# Patient Record
Sex: Female | Born: 1995 | Race: Black or African American | Hispanic: No | Marital: Married | Smoking: Never smoker
Health system: Southern US, Community
[De-identification: ages and names within clinical notes are randomized; demographics above are authoritative.]

## PROBLEM LIST (undated history)

## (undated) DIAGNOSIS — Z789 Other specified health status: Secondary | ICD-10-CM

## (undated) HISTORY — PX: NO PAST SURGERIES: SHX2092

---
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
## 2013-12-18 ENCOUNTER — Encounter (HOSPITAL_BASED_OUTPATIENT_CLINIC_OR_DEPARTMENT_OTHER): Payer: Self-pay | Admitting: Emergency Medicine

## 2013-12-18 DIAGNOSIS — Y92410 Unspecified street and highway as the place of occurrence of the external cause: Secondary | ICD-10-CM | POA: Insufficient documentation

## 2013-12-18 DIAGNOSIS — S99919A Unspecified injury of unspecified ankle, initial encounter: Principal | ICD-10-CM

## 2013-12-18 DIAGNOSIS — S8990XA Unspecified injury of unspecified lower leg, initial encounter: Secondary | ICD-10-CM | POA: Insufficient documentation

## 2013-12-18 DIAGNOSIS — S0990XA Unspecified injury of head, initial encounter: Secondary | ICD-10-CM | POA: Insufficient documentation

## 2013-12-18 DIAGNOSIS — Y9389 Activity, other specified: Secondary | ICD-10-CM | POA: Insufficient documentation

## 2013-12-18 DIAGNOSIS — S99929A Unspecified injury of unspecified foot, initial encounter: Principal | ICD-10-CM
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
## 2013-12-19 ENCOUNTER — Emergency Department (HOSPITAL_BASED_OUTPATIENT_CLINIC_OR_DEPARTMENT_OTHER)
Admission: EM | Admit: 2013-12-19 | Discharge: 2013-12-19 | Disposition: A | Discharge status: Home / Self Care | Payer: BC Managed Care – PPO | Attending: Emergency Medicine | Admitting: Emergency Medicine

## 2013-12-19 MED ORDER — IBUPROFEN 400 MG PO TABS
400.0000 mg | ORAL_TABLET | Freq: Once | ORAL | Status: AC
  Administered 2013-12-19: 400 mg via ORAL
  Filled 2013-12-19: qty 1
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
## 2013-12-19 NOTE — ED Notes (Signed)
Given note for work and ice pack at d/c. Family present
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
## 2015-03-26 ENCOUNTER — Encounter (HOSPITAL_BASED_OUTPATIENT_CLINIC_OR_DEPARTMENT_OTHER): Payer: Self-pay | Admitting: *Deleted

## 2015-03-26 ENCOUNTER — Emergency Department (HOSPITAL_BASED_OUTPATIENT_CLINIC_OR_DEPARTMENT_OTHER)
Admission: EM | Admit: 2015-03-26 | Discharge: 2015-03-26 | Disposition: A | Discharge status: Home / Self Care | Payer: Managed Care, Other (non HMO) | Attending: Emergency Medicine | Admitting: Emergency Medicine

## 2015-03-26 DIAGNOSIS — Z3202 Encounter for pregnancy test, result negative: Secondary | ICD-10-CM | POA: Diagnosis not present

## 2015-03-26 DIAGNOSIS — R1031 Right lower quadrant pain: Secondary | ICD-10-CM | POA: Insufficient documentation

## 2015-03-26 DIAGNOSIS — R109 Unspecified abdominal pain: Secondary | ICD-10-CM | POA: Diagnosis not present

## 2015-03-26 DIAGNOSIS — R197 Diarrhea, unspecified: Secondary | ICD-10-CM | POA: Diagnosis not present

## 2015-03-26 DIAGNOSIS — M545 Low back pain: Secondary | ICD-10-CM | POA: Insufficient documentation

## 2015-03-26 DIAGNOSIS — R112 Nausea with vomiting, unspecified: Secondary | ICD-10-CM | POA: Insufficient documentation

## 2015-03-26 DIAGNOSIS — K5900 Constipation, unspecified: Secondary | ICD-10-CM | POA: Diagnosis not present

## 2015-03-26 DIAGNOSIS — R1032 Left lower quadrant pain: Secondary | ICD-10-CM | POA: Diagnosis present

## 2015-03-26 LAB — URINALYSIS, ROUTINE W REFLEX MICROSCOPIC
Bilirubin Urine: NEGATIVE
Glucose, UA: NEGATIVE mg/dL
Hgb urine dipstick: NEGATIVE
Ketones, ur: NEGATIVE mg/dL
LEUKOCYTES UA: NEGATIVE
NITRITE: NEGATIVE
Protein, ur: NEGATIVE mg/dL
SPECIFIC GRAVITY, URINE: 1.029 (ref 1.005–1.030)
UROBILINOGEN UA: 0.2 mg/dL (ref 0.0–1.0)
pH: 6.5 (ref 5.0–8.0)

## 2015-03-26 LAB — PREGNANCY, URINE: PREG TEST UR: NEGATIVE

## 2015-03-26 NOTE — ED Notes (Signed)
Pt c/o diffuse add pain and nausea "off and on" x 2 months

## 2015-03-26 NOTE — ED Provider Notes (Signed)
CSN: [id number]     Arrival date & time 03/26/15  1539 History  This chart was scribed for  Zadie Rhineonald Erasmo Vertz, MD by Bethel BornBritney McCollum, ED Scribe. This patient was seen in room MH07/MH07 and the patient's care was started at 4:06 PM.   Chief Complaint  Patient presents with  . Abdominal Pain    Patient is a 19 y.o. female presenting with abdominal pain. The history is provided by the patient. No language interpreter was used.  Abdominal Pain Pain location:  LLQ and RLQ Pain quality: cramping   Pain severity:  Moderate Duration:  8 weeks Timing:  Intermittent Progression:  Unchanged Chronicity:  New Relieved by:  Urination Associated symptoms: constipation, diarrhea, hematochezia, nausea and vomiting   Associated symptoms: no fever, no melena, no vaginal bleeding and no vaginal discharge   Nausea:    Severity:  Mild   Timing:  Intermittent Vomiting:    Quality:  Stomach contents   Timing:  Sporadic Risk factors: has not had multiple surgeries    Shelley Preston is a 19 y.o. female who presents to the Emergency Department complaining of intermittent lower abdominal pain with onset 2 months ago. She describes the pain as cramping and rates in 4/10 in severity. Today the pain improved after urination. Associated symptoms include intermittent nausea, vomiting (none today), alternate diarrhea and constipation, and lower back pain. Pt notes episodes of small amounts of blood in her stool. She is concerned for pregnancy and notes that for the last several months her menstrual periods have been unusually short lasting for 2 days.  Pt denies recent weight change, fever, vaginal discharge, and current vaginal bleeding.   PMH - none  History  Substance Use Topics  . Smoking status: Never Smoker   . Smokeless tobacco: Never Used  . Alcohol Use: No   OB History    No data available     Review of Systems  Constitutional: Negative for fever.  Gastrointestinal: Positive for nausea, vomiting,  abdominal pain, diarrhea, constipation and hematochezia. Negative for melena.  Genitourinary: Negative for vaginal bleeding and vaginal discharge.  Musculoskeletal: Positive for back pain. Arthralgias: lower.  All other systems reviewed and are negative.     Allergies  Review of patient's allergies indicates no known allergies.  Home Medications   Prior to Admission medications   Not on File   Triage Vitals: BP 123/65 mmHg  Pulse 100  Temp(Src) 98.3 F (36.8 C)  Resp 18  Ht 5\' 5"  (1.651 m)  Wt 170 lb (77.111 kg)  BMI 28.29 kg/m2  SpO2 100%  LMP 02/14/2015 Physical Exam CONSTITUTIONAL: Well developed/well nourished HEAD: Normocephalic/atraumatic EYES: EOMI/PERRL ENMT: Mucous membranes moist NECK: supple no meningeal signs SPINE/BACK:entire spine nontender CV: S1/S2 noted, no murmurs/rubs/gallops noted LUNGS: Lungs are clear to auscultation bilaterally, no apparent distress ABDOMEN: soft, nontender, no rebound or guarding, bowel sounds noted throughout abdomen GU:no cva tenderness NEURO: Pt is awake/alert/appropriate, moves all extremitiesx4.  No facial droop.   EXTREMITIES: pulses normal/equal, full ROM SKIN: warm, color normal PSYCH: no abnormalities of mood noted, alert and oriented to situation  ED Course  Procedures  DIAGNOSTIC STUDIES: Oxygen Saturation is 100% on RA, normal by my interpretation.    COORDINATION OF CARE: 4:12 PM Discussed treatment plan which includes lab work with pt at bedside and pt agreed to plan. Pt without any symptoms at this time abd soft She is well appearing Symptoms on/off for 2 months She has no signs of acute abd/GYN emergency at this  time She mentioned small amt of blood in stool but no other reports signs of GI bleed She is stable for d/c home We discussed strict return precautions  Labs Review Labs Reviewed  PREGNANCY, URINE  URINALYSIS, ROUTINE W REFLEX MICROSCOPIC (NOT AT [facility])    Imaging Review No results  found.   EKG Interpretation None      MDM   Final diagnoses:  Abdominal pain, unspecified abdominal location     Nursing notes including past medical history and social history reviewed and considered in documentation Labs/vital reviewed myself and considered during evaluation   I personally performed the services described in this documentation, which was scribed in my presence. The recorded information has been reviewed and is accurate.    Zadie Rhine, MD 03/26/15 [phone]

## 2015-03-26 NOTE — Discharge Instructions (Signed)

## 2015-05-18 ENCOUNTER — Inpatient Hospital Stay (HOSPITAL_COMMUNITY)

## 2015-05-18 ENCOUNTER — Encounter (HOSPITAL_COMMUNITY): Payer: Self-pay | Admitting: *Deleted

## 2015-05-18 ENCOUNTER — Inpatient Hospital Stay (HOSPITAL_COMMUNITY)
Admission: AD | Admit: 2015-05-18 | Discharge: 2015-05-19 | Disposition: A | Discharge status: Home / Self Care | Source: Ambulatory Visit | Attending: Obstetrics and Gynecology | Admitting: Obstetrics and Gynecology

## 2015-05-18 DIAGNOSIS — Z833 Family history of diabetes mellitus: Secondary | ICD-10-CM | POA: Insufficient documentation

## 2015-05-18 DIAGNOSIS — N838 Other noninflammatory disorders of ovary, fallopian tube and broad ligament: Secondary | ICD-10-CM

## 2015-05-18 DIAGNOSIS — K5900 Constipation, unspecified: Secondary | ICD-10-CM | POA: Diagnosis not present

## 2015-05-18 DIAGNOSIS — R197 Diarrhea, unspecified: Secondary | ICD-10-CM | POA: Insufficient documentation

## 2015-05-18 DIAGNOSIS — R102 Pelvic and perineal pain unspecified side: Secondary | ICD-10-CM

## 2015-05-18 DIAGNOSIS — N839 Noninflammatory disorder of ovary, fallopian tube and broad ligament, unspecified: Secondary | ICD-10-CM | POA: Diagnosis not present

## 2015-05-18 HISTORY — DX: Other specified health status: Z78.9

## 2015-05-18 LAB — URINALYSIS, ROUTINE W REFLEX MICROSCOPIC
Bilirubin Urine: NEGATIVE
GLUCOSE, UA: NEGATIVE mg/dL
HGB URINE DIPSTICK: NEGATIVE
KETONES UR: 40 mg/dL — AB
LEUKOCYTES UA: NEGATIVE
Nitrite: NEGATIVE
PROTEIN: NEGATIVE mg/dL
Specific Gravity, Urine: >1.03 — ABNORMAL HIGH (ref 1.005–1.030)
Urobilinogen, UA: 0.2 mg/dL (ref 0.0–1.0)
pH: 5.5 (ref 5.0–8.0)

## 2015-05-18 LAB — CBC
HCT: 37.3 % (ref 36.0–46.0)
Hemoglobin: 12.7 g/dL (ref 12.0–15.0)
MCH: 31.5 pg (ref 26.0–34.0)
MCHC: 34 g/dL (ref 30.0–36.0)
MCV: 92.6 fL (ref 78.0–100.0)
PLATELETS: 234 10*3/uL (ref 150–400)
RBC: 4.03 MIL/uL (ref 3.87–5.11)
RDW: 12.5 % (ref 11.5–15.5)
WBC: 7.8 10*3/uL (ref 4.0–10.5)

## 2015-05-18 LAB — POCT PREGNANCY, URINE: Preg Test, Ur: NEGATIVE

## 2015-05-18 NOTE — MAU Provider Note (Signed)
History     CSN: [id number]  Arrival date and time: 05/18/15 6440   First Provider Initiated Contact with Patient 05/18/15 2020      Chief Complaint  Patient presents with  . Back Pain  . Abdominal Pain   HPI This is a 19 y.o. female who presents with c/o lower abdominal pain "for months".  States had Nexplanon removed in March and has had no periods since. Thought she might be pregnant. Has had lower abdominal pain and low back pain for many months. States saw Dr Langston Masker in April but was not having pain then. States they did a blood test for pregnancy and never called her with results. States has had a lot of trouble getting office to call her back.   ALso has a history of alternating constipation and diarrhea. Last BM 2 days ago.   ER MD note from June 2016 18 y.o. female who presents to the Emergency Department complaining of intermittent lower abdominal pain with onset 2 months ago. She describes the pain as cramping and rates in 4/10 in severity. Today the pain improved after urination. Associated symptoms include intermittent nausea, vomiting (none today), alternate diarrhea and constipation, and lower back pain. Pt notes episodes of small amounts of blood in her stool. She is concerned for pregnancy and notes that for the last several months her menstrual periods have been unusually short lasting for 2 days. Pt denies recent weight change, fever, vaginal discharge, and current vaginal bleeding.   RN Note:  Expand All Collapse All   For 2 days having lower abd pain and lower back pain. Vomiting off and on for months along with diarrhea or constipation -depending on foods eaten. Nexplanon removed 3/24 and no period since then. Had irregular bleeding when nexplanon was in place. States few days ago saw streak of blood on stool. No hemorrhoids of which pt is aware          OB History    Gravida Para Term Preterm AB TAB SAB Ectopic Multiple Living         Past Medical History  Diagnosis Date  . Medical history non-contributory     Past Surgical History  Procedure Laterality Date  . No past surgeries      Family History  Problem Relation Age of Onset  . Diabetes Father     Social History  Substance Use Topics  . Smoking status: Never Smoker   . Smokeless tobacco: Never Used  . Alcohol Use: No    Allergies: No Known Allergies  No prescriptions prior to admission    Review of Systems  Constitutional: Negative for fever, chills and malaise/fatigue.  Gastrointestinal: Positive for abdominal pain, diarrhea and constipation. Negative for nausea and vomiting.  Genitourinary: Negative for dysuria.  Musculoskeletal: Positive for back pain (low back). Negative for myalgias.  Neurological: Negative for headaches.   Physical Exam   Blood pressure 134/78, pulse 108, temperature 97.9 F (36.6 C), resp. rate 18, height 5' 6.5" (1.689 m), weight 169 lb 6.4 oz (76.839 kg).  Physical Exam  Constitutional: She is oriented to person, place, and time. She appears well-developed and well-nourished. No distress.  HENT:  Head: Normocephalic.  Cardiovascular: Normal rate and regular rhythm.   Respiratory: Effort normal. No respiratory distress.  GI: Soft. She exhibits no distension and no mass. There is tenderness (lower abdomen). There is no rebound and no guarding.  Genitourinary: Vagina normal. No vaginal  discharge found.  Declines STD testing Cervix long and closed, posterior Uterus feels enlarged, esp posteriorly Irregular surface to uterus. ?implacted stool could be a factor in this exam, difficult to differentiate exam Adnexae tender bilaterally  Musculoskeletal: Normal range of motion.  Neurological: She is alert and oriented to person, place, and time.  Skin: Skin is warm and dry.  Psychiatric: She has a normal mood and affect.    MAU Course  Procedures  MDM Consulted Dr Marcelle Overlie re: presentation and exam Will check  CBC and pelvic US  Results for orders placed or performed during the hospital encounter of 05/18/15 (from the past 24 hour(s))  Urinalysis, Routine w reflex microscopic (not at [facility])     Status: Abnormal   Collection Time: 05/18/15  7:45 PM  Result Value Ref Range   Color, Urine YELLOW YELLOW   APPearance CLEAR CLEAR   Specific Gravity, Urine >1.030 (H) 1.005 - 1.030   pH 5.5 5.0 - 8.0   Glucose, UA NEGATIVE NEGATIVE mg/dL   Hgb urine dipstick NEGATIVE NEGATIVE   Bilirubin Urine NEGATIVE NEGATIVE   Ketones, ur 40 (A) NEGATIVE mg/dL   Protein, ur NEGATIVE NEGATIVE mg/dL   Urobilinogen, UA 0.2 0.0 - 1.0 mg/dL   Nitrite NEGATIVE NEGATIVE   Leukocytes, UA NEGATIVE NEGATIVE  Pregnancy, urine POC     Status: None   Collection Time: 05/18/15  7:50 PM  Result Value Ref Range   Preg Test, Ur NEGATIVE NEGATIVE  CBC     Status: None   Collection Time: 05/18/15  9:00 PM  Result Value Ref Range   WBC 7.8 4.0 - 10.5 K/uL   RBC 4.03 3.87 - 5.11 MIL/uL   Hemoglobin 12.7 12.0 - 15.0 g/dL   HCT 91.4 78.2 - 95.6 %   MCV 92.6 78.0 - 100.0 fL   MCH 31.5 26.0 - 34.0 pg   MCHC 34.0 30.0 - 36.0 g/dL   RDW 21.3 08.6 - 57.8 %   Platelets 234 150 - 400 K/uL   Consulted Dr Marcelle Overlie with exam findings and results If [place] normal, have patient follow up in office Awaiting [place] to come take patient for study. Report given to oncoming provider  US Transvaginal Non-ob  05/18/2015   CLINICAL DATA:  Patient with lower abdominal and back pain for multiple months.  EXAM: TRANSABDOMINAL AND TRANSVAGINAL ULTRASOUND OF PELVIS  TECHNIQUE: Both transabdominal and transvaginal ultrasound examinations of the pelvis were performed. Transabdominal technique was performed for global imaging of the pelvis including uterus, ovaries, adnexal regions, and pelvic cul-de-sac. It was necessary to proceed with endovaginal exam following the transabdominal exam to visualize the endometrium and adnexal structures.  COMPARISON:  None   FINDINGS: Uterus  Measurements: 7.3 x 4.1 x 4.1 cm. No fibroids or other mass visualized. Retroverted.  Endometrium  Thickness: 8 mm.  No focal abnormality visualized.  Right ovary  Measurements: 4.7 x 4.4 x 5.4 cm. There is a 5.3 x 3.9 x 5.3 cm mixed echogenicity but predominately hyperechoic mass involving the right ovary. There is posterior acoustic shadowing. There is suggestion of a more central focal area of increased echogenicity with shadowing which may represent calcification.  Left ovary  Measurements: 3.0 x 1.5 x 1.8 cm. Normal appearance/no adnexal mass.  Other findings  Trace fluid in the pelvis.  IMPRESSION: There is a mass involving the right ovary measuring up to 5.3 cm which given current imaging findings is favored to represent a dermoid. Recommend dedicated evaluation with pelvic MRI.  In the setting of acute pelvic pain, given the enlargement of the right ovary with associated mass, intermittent torsion is not excluded.   Electronically Signed   By: Annia Belt M.D.   On: 05/18/2015 23:12   US Pelvis Complete  05/18/2015   CLINICAL DATA:  Patient with lower abdominal and back pain for multiple months.  EXAM: TRANSABDOMINAL AND TRANSVAGINAL ULTRASOUND OF PELVIS  TECHNIQUE: Both transabdominal and transvaginal ultrasound examinations of the pelvis were performed. Transabdominal technique was performed for global imaging of the pelvis including uterus, ovaries, adnexal regions, and pelvic cul-de-sac. It was necessary to proceed with endovaginal exam following the transabdominal exam to visualize the endometrium and adnexal structures.  COMPARISON:  None  FINDINGS: Uterus  Measurements: 7.3 x 4.1 x 4.1 cm. No fibroids or other mass visualized. Retroverted.  Endometrium  Thickness: 8 mm.  No focal abnormality visualized.  Right ovary  Measurements: 4.7 x 4.4 x 5.4 cm. There is a 5.3 x 3.9 x 5.3 cm mixed echogenicity but predominately hyperechoic mass involving the right ovary. There is posterior  acoustic shadowing. There is suggestion of a more central focal area of increased echogenicity with shadowing which may represent calcification.  Left ovary  Measurements: 3.0 x 1.5 x 1.8 cm. Normal appearance/no adnexal mass.  Other findings  Trace fluid in the pelvis.  IMPRESSION: There is a mass involving the right ovary measuring up to 5.3 cm which given current imaging findings is favored to represent a dermoid. Recommend dedicated evaluation with pelvic MRI.  In the setting of acute pelvic pain, given the enlargement of the right ovary with associated mass, intermittent torsion is not excluded.   Electronically Signed   By: Annia Belt M.D.   On: 05/18/2015 23:12   [place] Art/ven Flow Abd Pelv Doppler  05/19/2015   CLINICAL DATA:  Lower abdominal and back pain for several months; request evaluation of the ovaries. Initial encounter.  EXAM: DOPPLER ULTRASOUND OF OVARIES  TECHNIQUE: Color and duplex Doppler ultrasound was utilized to evaluate blood flow to the ovaries.  COMPARISON:  Pelvic ultrasound performed earlier today at 10:31 p.m.  FINDINGS: Pulsed Doppler evaluation of both ovaries demonstrates normal low-resistance arterial and venous waveforms.  IMPRESSION: No evidence for ovarian torsion.   Electronically Signed   By: Roanna Raider M.D.   On: 05/19/2015 00:21    Dr. Marcelle Overlie informed of U/S.  Will obtain doppler.  If normal - okay to discharge pt to home with f/u in office.   Assessment and Plan  A:  Pelvic pain, longstanding       Constipation and diarrhea, probably IBS  P:  Discharge to home OTC ibuprofen for discomfort F/u in office - call to schedule.  Per radiology - pt requires Pelvic MRI.  Obtain outpatient.  Patient may return to MAU as needed or if her condition were to change or worsen   [facility] 05/18/2015, 8:45 PM

## 2015-05-18 NOTE — MAU Note (Addendum)
For 2 days having lower abd pain and lower back pain. Vomiting off and on for months along with diarrhea or constipation -depending on foods eaten. Nexplanon removed 3/24 and no period since then. Had irregular bleeding when nexplanon was in place. States few days ago saw streak of blood on stool. No hemorrhoids of which pt is aware

## 2015-05-19 DIAGNOSIS — N839 Noninflammatory disorder of ovary, fallopian tube and broad ligament, unspecified: Secondary | ICD-10-CM

## 2015-05-19 NOTE — Discharge Instructions (Signed)
Ovarian Cyst An ovarian cyst is a fluid-filled sac that forms on an ovary. The ovaries are small organs that produce eggs in women. Various types of cysts can form on the ovaries. Most are not cancerous. Many do not cause problems, and they often go away on their own. Some may cause symptoms and require treatment. Common types of ovarian cysts include:  Functional cysts--These cysts may occur every month during the menstrual cycle. This is normal. The cysts usually go away with the next menstrual cycle if the woman does not get pregnant. Usually, there are no symptoms with a functional cyst.  Endometrioma cysts--These cysts form from the tissue that lines the uterus. They are also called "chocolate cysts" because they become filled with blood that turns brown. This type of cyst can cause pain in the lower abdomen during intercourse and with your menstrual period.  Cystadenoma cysts--This type develops from the cells on the outside of the ovary. These cysts can get very big and cause lower abdomen pain and pain with intercourse. This type of cyst can twist on itself, cut off its blood supply, and cause severe pain. It can also easily rupture and cause a lot of pain.  Dermoid cysts--This type of cyst is sometimes found in both ovaries. These cysts may contain different kinds of body tissue, such as skin, teeth, hair, or cartilage. They usually do not cause symptoms unless they get very big.  Theca lutein cysts--These cysts occur when too much of a certain hormone (human chorionic gonadotropin) is produced and overstimulates the ovaries to produce an egg. This is most common after procedures used to assist with the conception of a baby (in vitro fertilization). CAUSES   Fertility drugs can cause a condition in which multiple large cysts are formed on the ovaries. This is called ovarian hyperstimulation syndrome.  A condition called polycystic ovary syndrome can cause hormonal imbalances that can lead to  nonfunctional ovarian cysts. SIGNS AND SYMPTOMS  Many ovarian cysts do not cause symptoms. If symptoms are present, they may include:  Pelvic pain or pressure.  Pain in the lower abdomen.  Pain during sexual intercourse.  Increasing girth (swelling) of the abdomen.  Abnormal menstrual periods.  Increasing pain with menstrual periods.  Stopping having menstrual periods without being pregnant. DIAGNOSIS  These cysts are commonly found during a routine or annual pelvic exam. Tests may be ordered to find out more about the cyst. These tests may include:  Ultrasound.  X-ray of the pelvis.  CT scan.  MRI.  Blood tests. TREATMENT  Many ovarian cysts go away on their own without treatment. Your health care provider may want to check your cyst regularly for 2-3 months to see if it changes. For women in menopause, it is particularly important to monitor a cyst closely because of the higher rate of ovarian cancer in menopausal women. When treatment is needed, it may include any of the following:  A procedure to drain the cyst (aspiration). This may be done using a long needle and ultrasound. It can also be done through a laparoscopic procedure. This involves using a thin, lighted tube with a tiny camera on the end (laparoscope) inserted through a small incision.  Surgery to remove the whole cyst. This may be done using laparoscopic surgery or an open surgery involving a larger incision in the lower abdomen.  Hormone treatment or birth control pills. These methods are sometimes used to help dissolve a cyst. HOME CARE INSTRUCTIONS   Only take over-the-counter   or prescription medicines as directed by your health care provider.  Follow up with your health care provider as directed.  Get regular pelvic exams and Pap tests. SEEK MEDICAL CARE IF:   Your periods are late, irregular, or painful, or they stop.  Your pelvic pain or abdominal pain does not go away.  Your abdomen becomes  larger or swollen.  You have pressure on your bladder or trouble emptying your bladder completely.  You have pain during sexual intercourse.  You have feelings of fullness, pressure, or discomfort in your stomach.  You lose weight for no apparent reason.  You feel generally ill.  You become constipated.  You lose your appetite.  You develop acne.  You have an increase in body and facial hair.  You are gaining weight, without changing your exercise and eating habits.  You think you are pregnant. SEEK IMMEDIATE MEDICAL CARE IF:   You have increasing abdominal pain.  You feel sick to your stomach (nauseous), and you throw up (vomit).  You develop a fever that comes on suddenly.  You have abdominal pain during a bowel movement.  Your menstrual periods become heavier than usual. MAKE SURE YOU:  Understand these instructions.  Will watch your condition.  Will get help right away if you are not doing well or get worse. Document Released: 09/15/2005 Document Revised: 09/20/2013 Document Reviewed: 05/23/2013 [employer] Patient Information 2015 [employer]. This information is not intended to replace advice given to you by your health care provider. Make sure you discuss any questions you have with your health care provider.  

## 2016-05-09 IMAGING — US US PELVIS COMPLETE
1 series · 13 of 25 positions shown · non-contrast
Comparison: None

CLINICAL DATA: Patient with lower abdominal and back pain for
multiple months.

EXAM:
TRANSABDOMINAL AND TRANSVAGINAL ULTRASOUND OF PELVIS
TECHNIQUE: Both transabdominal and transvaginal ultrasound examinations of the
pelvis were performed. Transabdominal technique was performed for
global imaging of the pelvis including uterus, ovaries, adnexal
regions, and pelvic cul-de-sac. It was necessary to proceed with
endovaginal exam following the transabdominal exam to visualize the
endometrium and adnexal structures.

[Series 1: us pelvis complete · 13 of 99 slices shown]
[im 1/99]
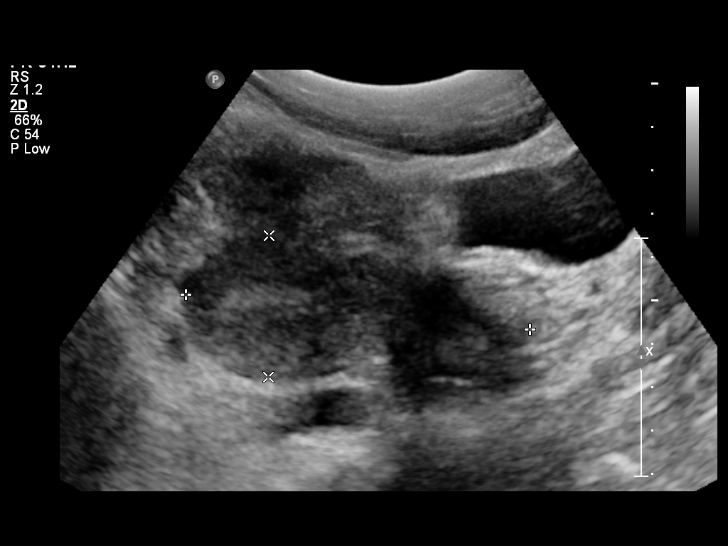
[im 9/99]
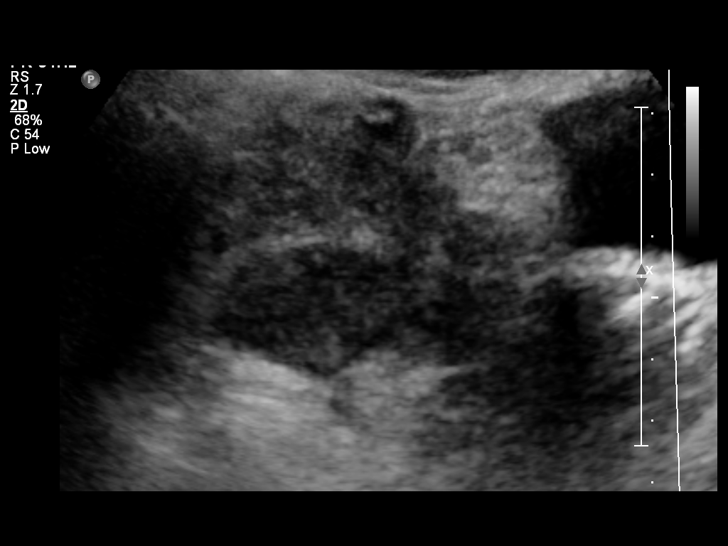
[im 17/99]
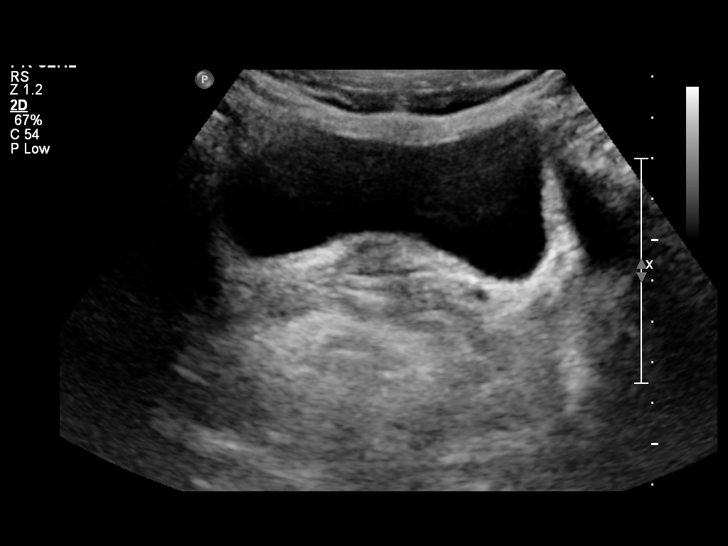
[im 25/99]
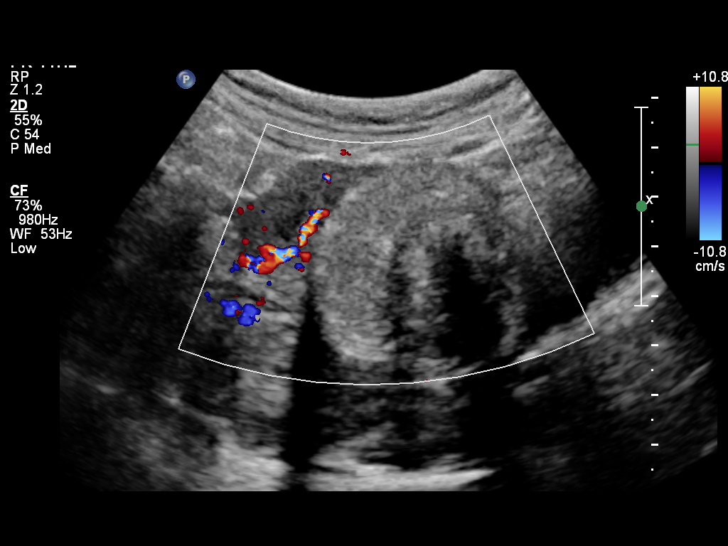
[im 33/99]
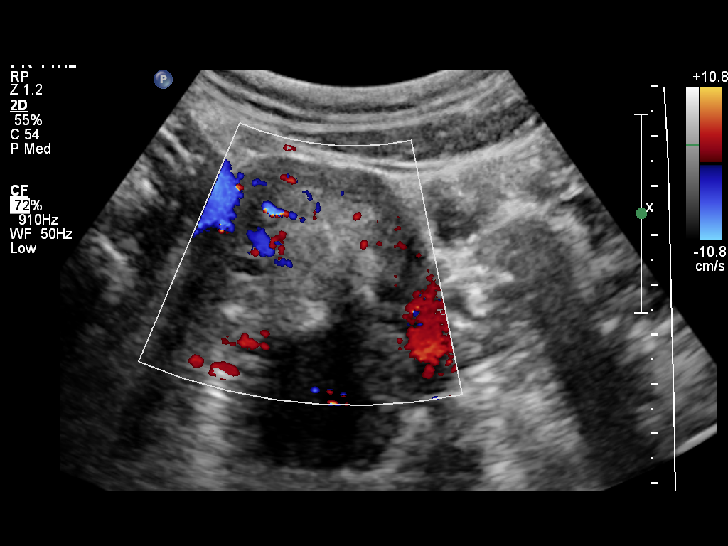
[im 41/99]
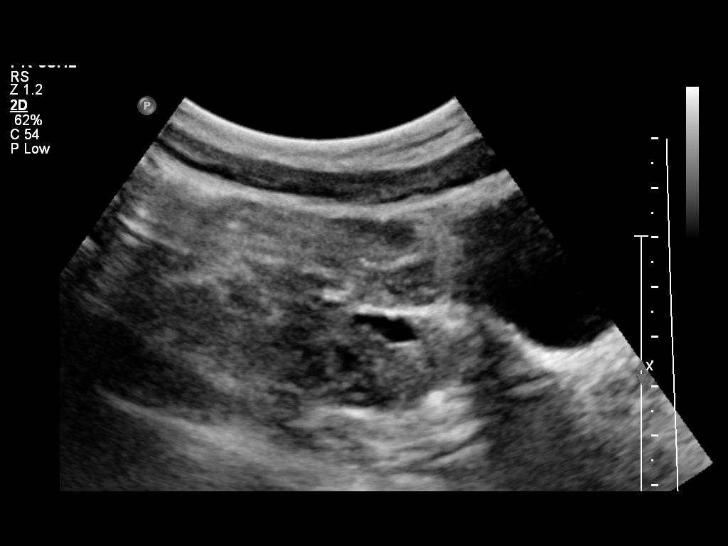
[im 50/99]
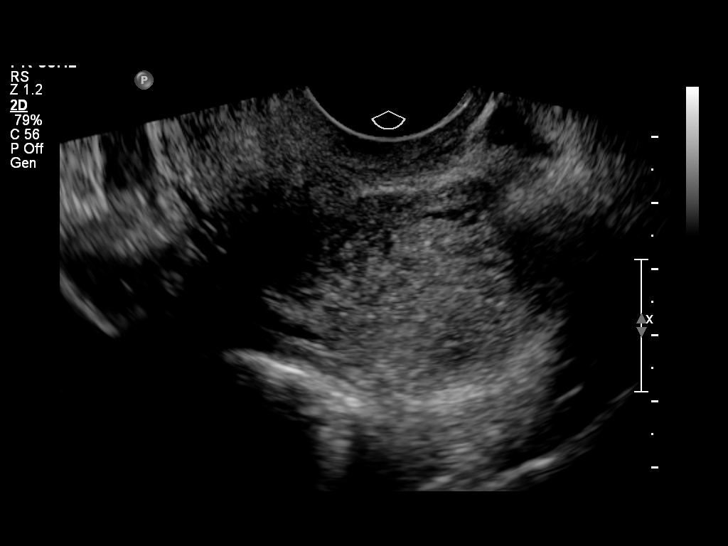
[im 58/99]
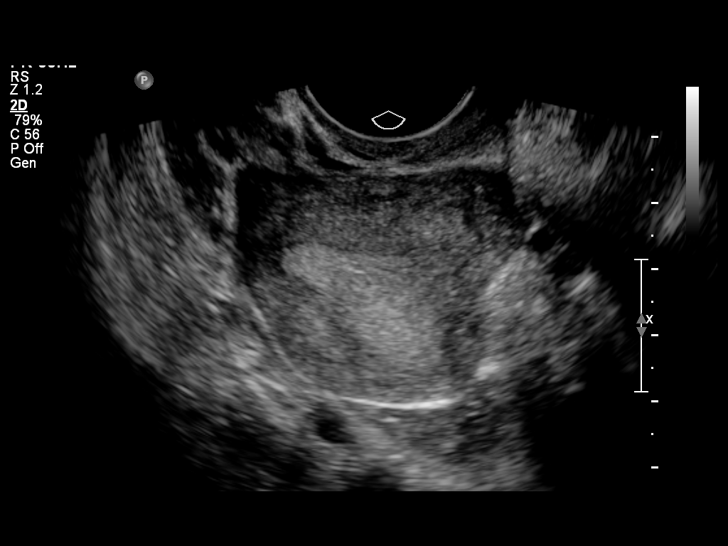
[im 66/99]
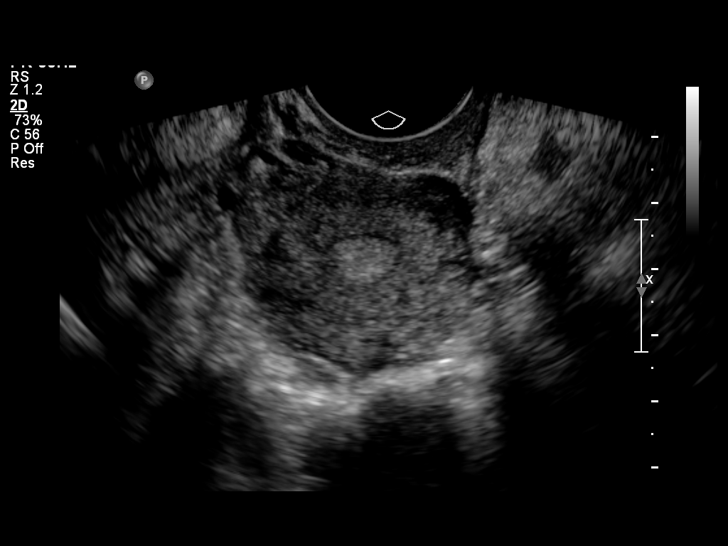
[im 74/99]
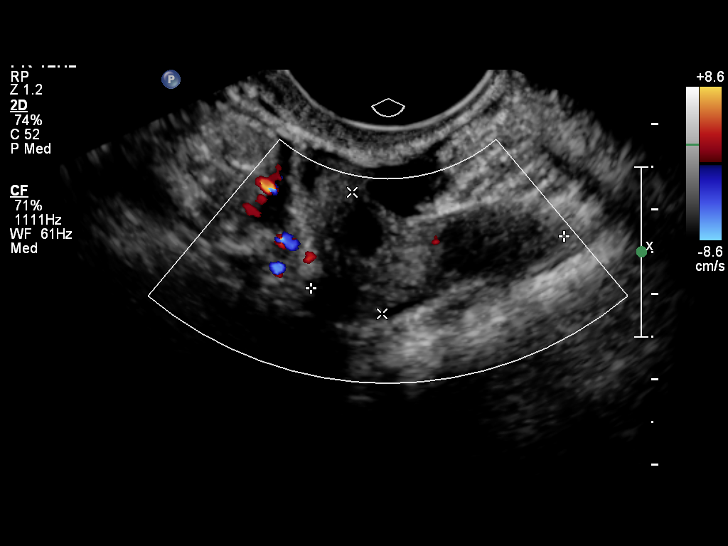
[im 82/99]
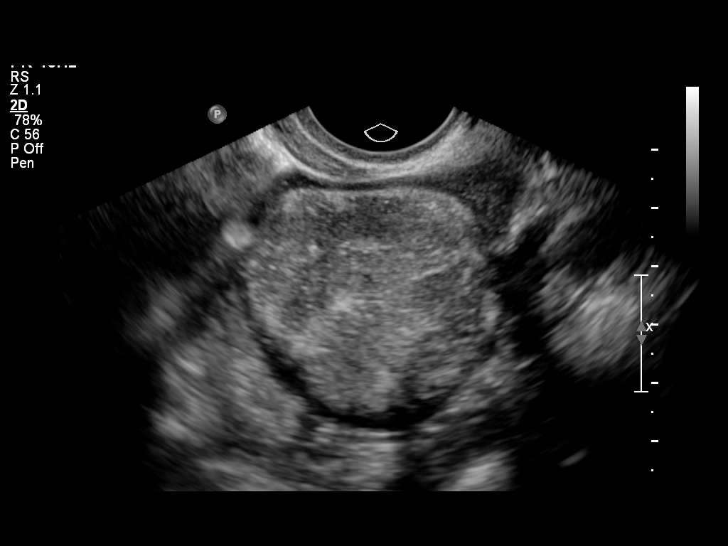
[im 90/99]
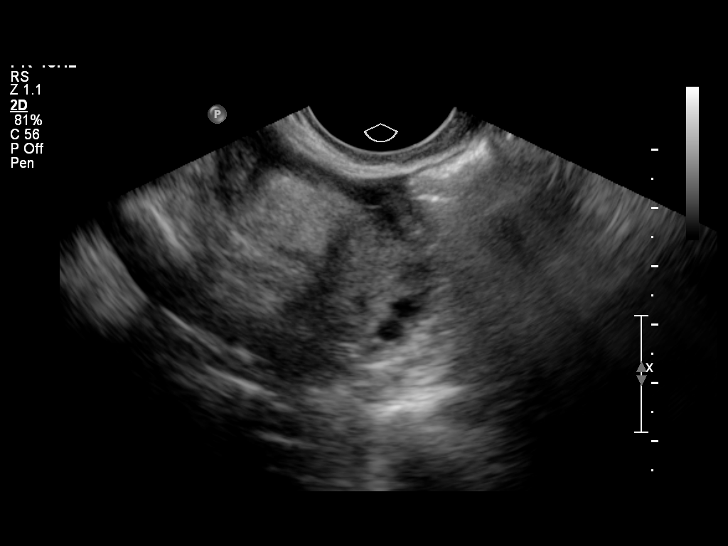
[im 99/99]
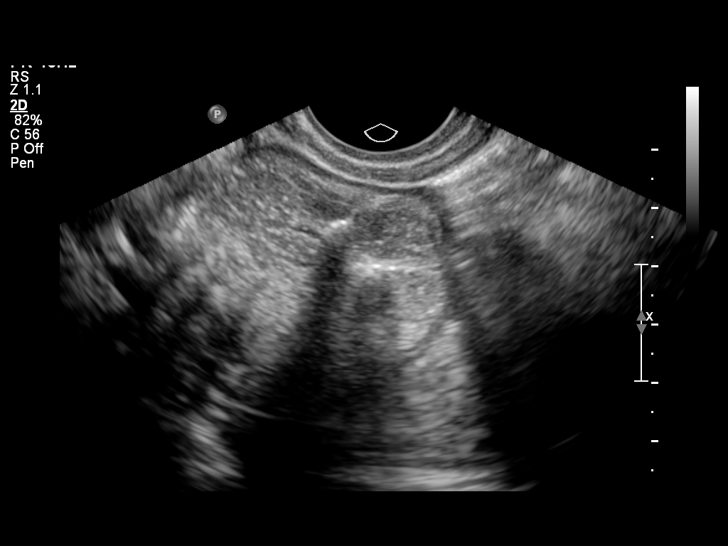

[13 of 25 positions shown; findings below may reference images not displayed]

FINDINGS: Uterus

Measurements: 7.3 x 4.1 x 4.1 cm. No fibroids or other mass
visualized. Retroverted.

Endometrium

Thickness: 8 mm.  No focal abnormality visualized.

Right ovary

Measurements: 4.7 x 4.4 x 5.4 cm. There is a 5.3 x 3.9 x 5.3 cm
mixed echogenicity but predominately hyperechoic mass involving the
right ovary. There is posterior acoustic shadowing. There is
suggestion of a more central focal area of increased echogenicity
with shadowing which may represent calcification.

Left ovary

Measurements: 3.0 x 1.5 x 1.8 cm. Normal appearance/no adnexal mass.

Other findings

Trace fluid in the pelvis.
IMPRESSION: There is a mass involving the right ovary measuring up to 5.3 cm
which given current imaging findings is favored to represent a
dermoid. Recommend dedicated evaluation with pelvic MRI.

In the setting of acute pelvic pain, given the enlargement of the
right ovary with associated mass, intermittent torsion is not
excluded.

## 2016-05-09 IMAGING — US US ART/VEN ABD/PELV/SCROTUM DOPPLER LTD
1 series · 14 of 16 positions shown · non-contrast
Comparison: Pelvic ultrasound performed earlier today at [DATE] p.m.

CLINICAL DATA: Lower abdominal and back pain for several months;
request evaluation of the ovaries. Initial encounter.

EXAM:
DOPPLER ULTRASOUND OF OVARIES
TECHNIQUE: Color and duplex Doppler ultrasound was utilized to evaluate blood
flow to the ovaries.

[Series 1: us art/ven flow abd pel dop · 14 of 16 slices shown]
[im 1/16]
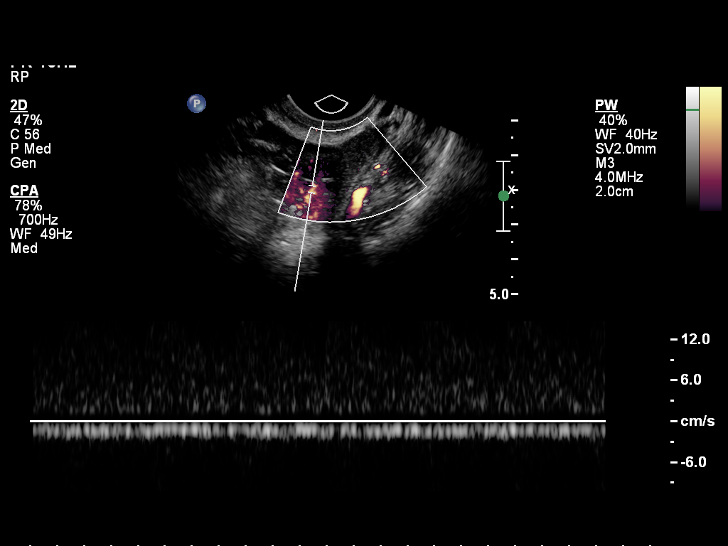
[im 2/16]
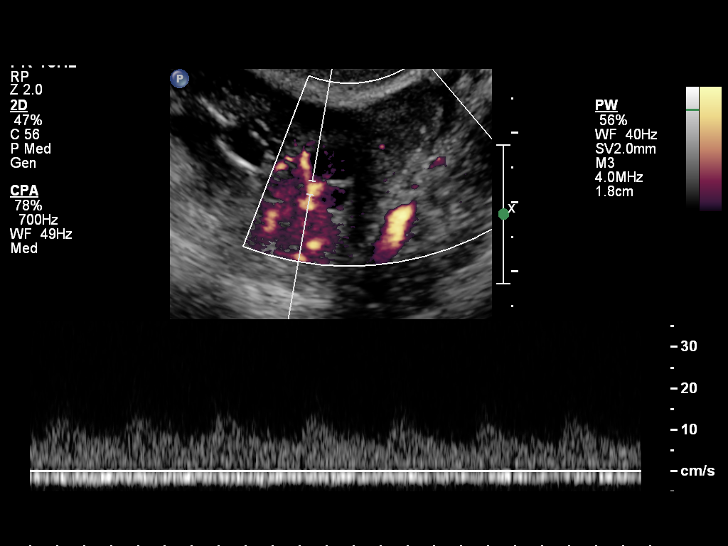
[im 3/16]
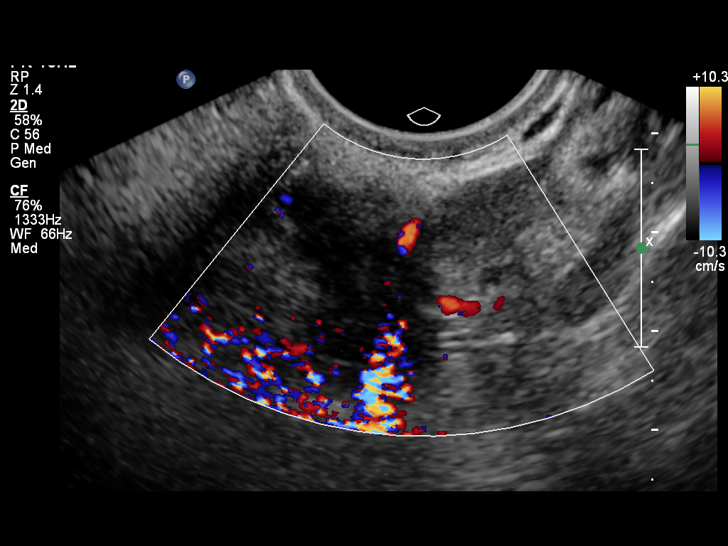
[im 5/16]
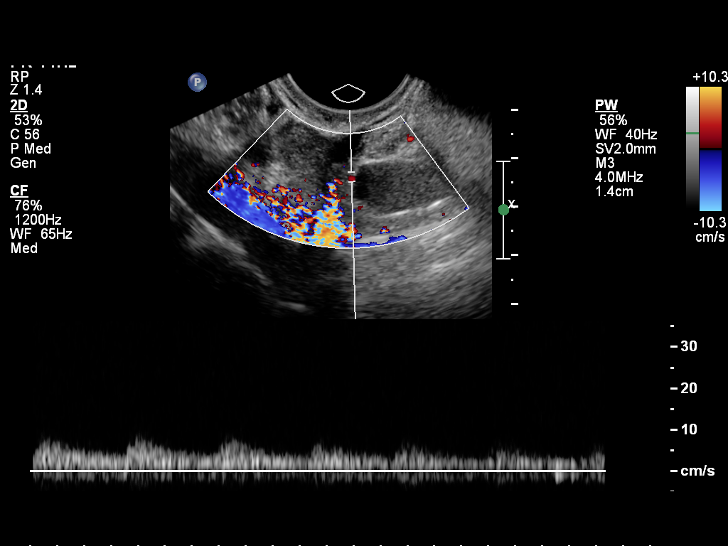
[im 6/16]
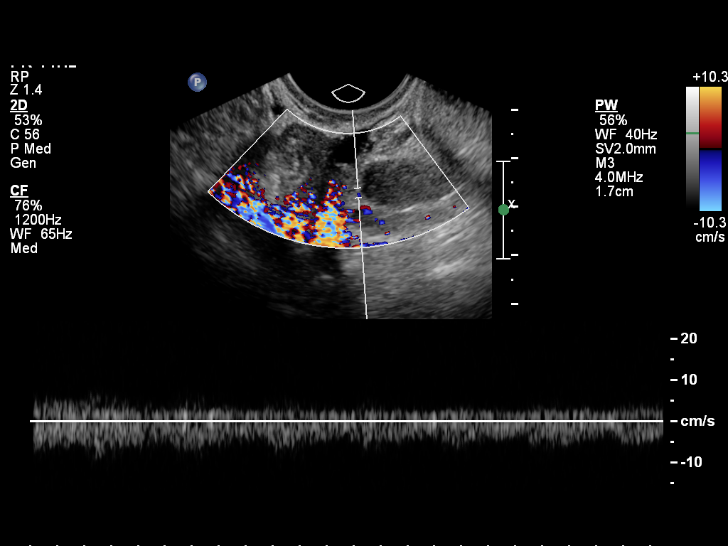
[im 7/16]
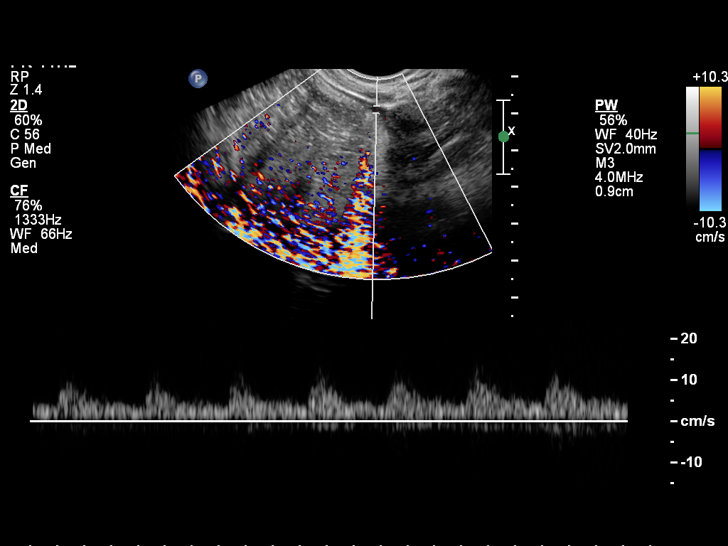
[im 8/16]
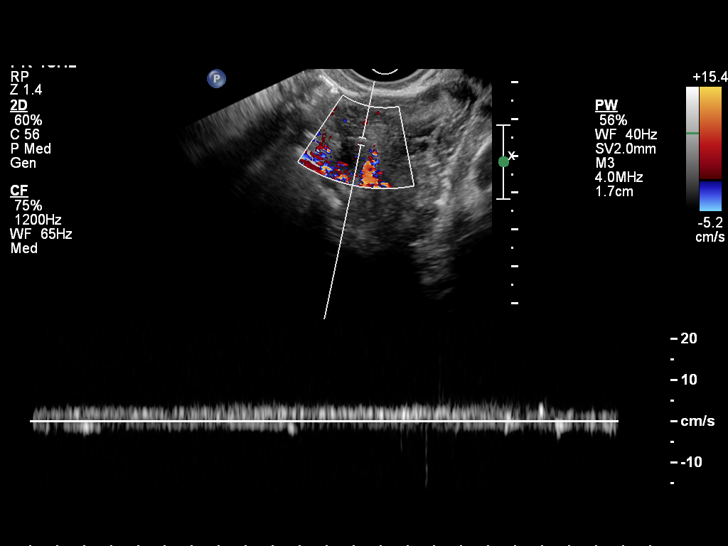
[im 9/16]
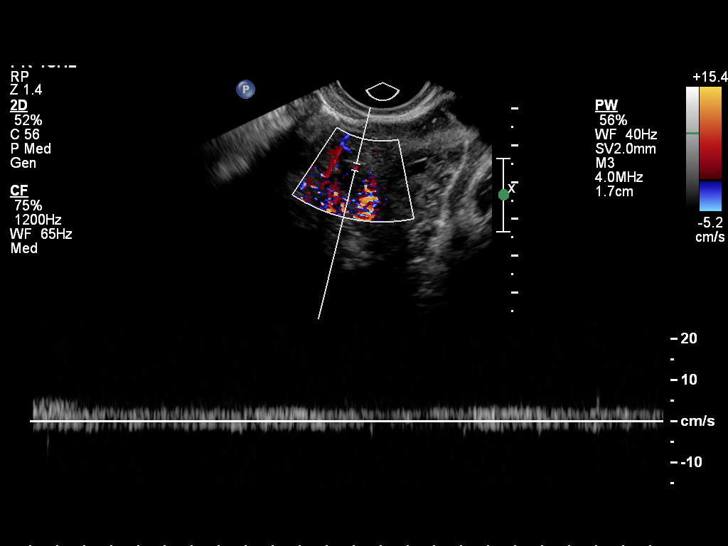
[im 10/16]
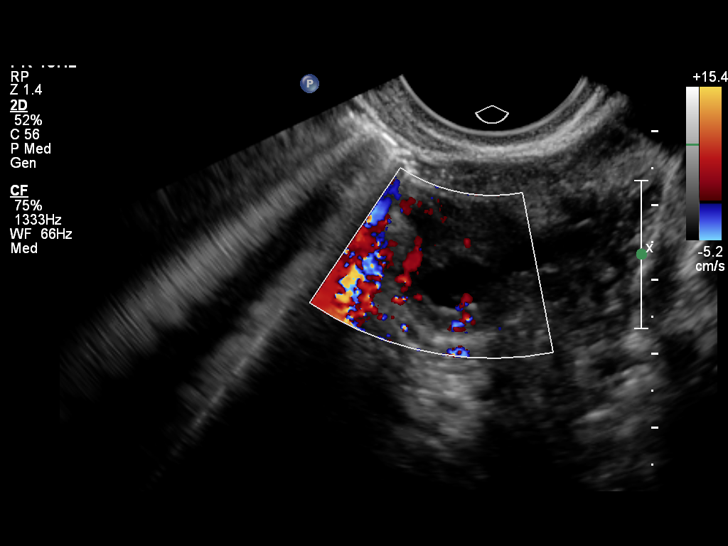
[im 11/16]
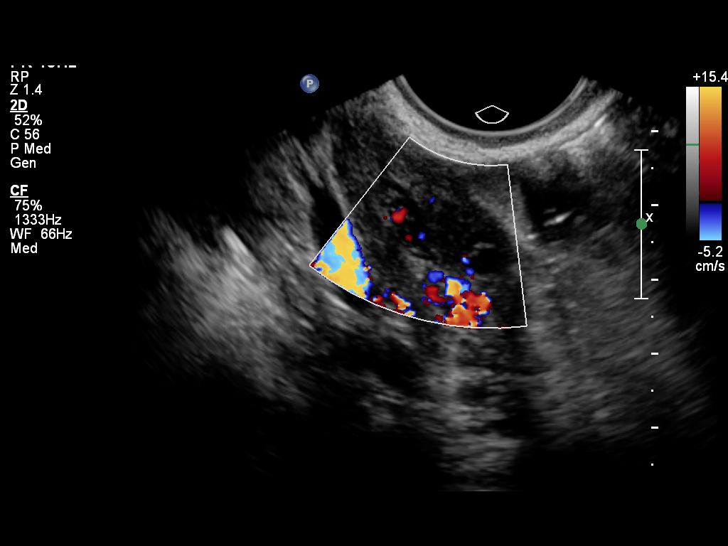
[im 13/16]
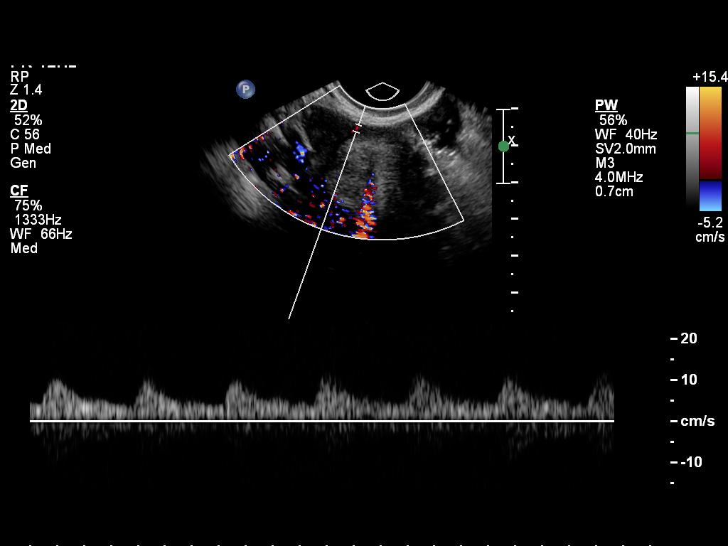
[im 14/16]
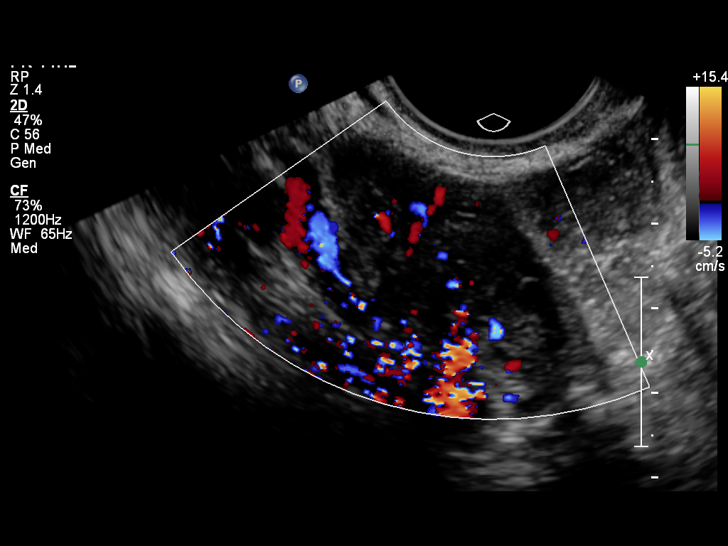
[im 15/16]
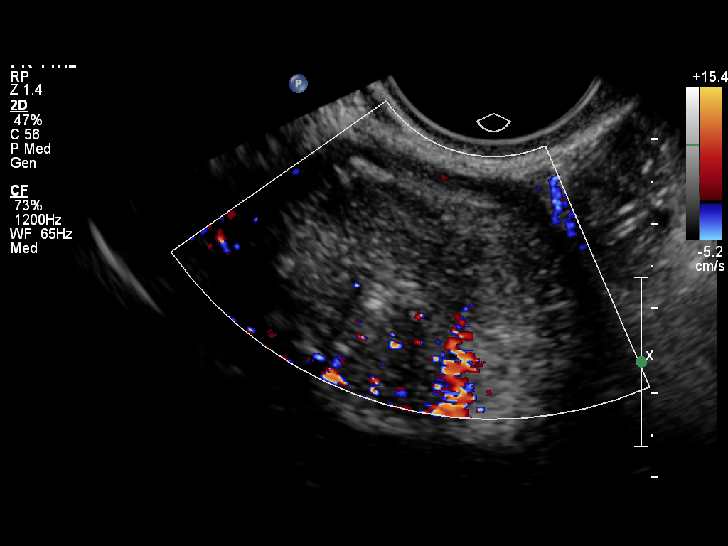
[im 16/16]
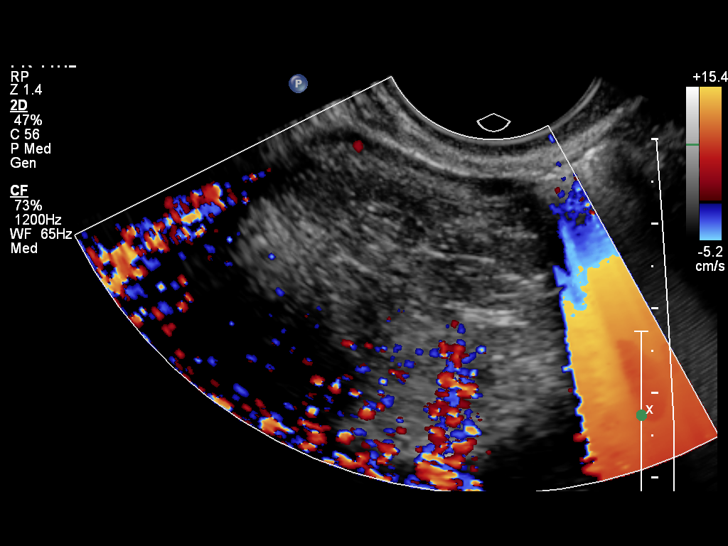

[14 of 16 positions shown; findings below may reference images not displayed]

FINDINGS: Pulsed Doppler evaluation of both ovaries demonstrates normal
low-resistance arterial and venous waveforms.
IMPRESSION: No evidence for ovarian torsion.

## 2018-06-07 ENCOUNTER — Ambulatory Visit: Payer: Self-pay | Admitting: Family Medicine

## 2020-02-02 ENCOUNTER — Ambulatory Visit: Payer: Managed Care, Other (non HMO) | Attending: Internal Medicine
